# Patient Record
Sex: Male | Born: 1960 | Race: White | Hispanic: No | Marital: Single | Smoking: Current every day smoker
Health system: Southern US, Community
[De-identification: ages and names within clinical notes are randomized; demographics above are authoritative.]

## PROBLEM LIST (undated history)

## (undated) DIAGNOSIS — F329 Major depressive disorder, single episode, unspecified: Secondary | ICD-10-CM

## (undated) DIAGNOSIS — F32A Depression, unspecified: Secondary | ICD-10-CM

## (undated) DIAGNOSIS — I10 Essential (primary) hypertension: Secondary | ICD-10-CM

## (undated) HISTORY — PX: CARDIAC CATHETERIZATION: SHX172

---
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
## 2003-05-01 ENCOUNTER — Emergency Department (HOSPITAL_COMMUNITY): Admission: EM | Admit: 2003-05-01 | Discharge: 2003-05-01 | Payer: Self-pay | Admitting: Emergency Medicine

## 2014-04-02 ENCOUNTER — Emergency Department (HOSPITAL_COMMUNITY)
Admission: EM | Admit: 2014-04-02 | Discharge: 2014-04-02 | Disposition: A | Discharge status: Home / Self Care | Payer: Worker's Compensation | Attending: Emergency Medicine | Admitting: Emergency Medicine

## 2014-04-02 ENCOUNTER — Encounter (HOSPITAL_COMMUNITY): Payer: Self-pay | Admitting: Emergency Medicine

## 2014-04-02 DIAGNOSIS — Z720 Tobacco use: Secondary | ICD-10-CM | POA: Insufficient documentation

## 2014-04-02 DIAGNOSIS — Z7982 Long term (current) use of aspirin: Secondary | ICD-10-CM | POA: Diagnosis not present

## 2014-04-02 DIAGNOSIS — M545 Low back pain: Secondary | ICD-10-CM | POA: Diagnosis present

## 2014-04-02 DIAGNOSIS — Z79899 Other long term (current) drug therapy: Secondary | ICD-10-CM | POA: Insufficient documentation

## 2014-04-02 DIAGNOSIS — M5431 Sciatica, right side: Secondary | ICD-10-CM | POA: Diagnosis not present

## 2014-04-02 MED ORDER — PREDNISONE 50 MG PO TABS
60.0000 mg | ORAL_TABLET | Freq: Once | ORAL | Status: AC
  Administered 2014-04-02: 60 mg via ORAL
  Filled 2014-04-02 (×2): qty 1

## 2014-04-02 MED ORDER — PREDNISONE 10 MG PO TABS: ORAL_TABLET | ORAL | Status: DC

## 2014-04-02 MED ORDER — HYDROCODONE-ACETAMINOPHEN 5-325 MG PO TABS: 1.0000 | ORAL_TABLET | ORAL | Status: DC | PRN

## 2014-04-02 MED ORDER — HYDROCODONE-ACETAMINOPHEN 5-325 MG PO TABS
1.0000 | ORAL_TABLET | Freq: Once | ORAL | Status: AC
  Administered 2014-04-02: 1 via ORAL
  Filled 2014-04-02: qty 1

## 2014-04-02 NOTE — ED Notes (Signed)
Patient with no complaints at this time. Respirations even and unlabored. Skin warm/dry. Discharge instructions reviewed with patient at this time. Patient given opportunity to voice concerns/ask questions. Patient discharged at this time and left Emergency Department with steady gait.   
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
## 2014-04-02 NOTE — Discharge Instructions (Signed)
Sciatica °Sciatica is pain, weakness, numbness, or tingling along the path of the sciatic nerve. The nerve starts in the lower back and runs down the back of each leg. The nerve controls the muscles in the lower leg and in the back of the knee, while also providing sensation to the back of the thigh, lower leg, and the sole of your foot. Sciatica is a symptom of another medical condition. For instance, nerve damage or certain conditions, such as a herniated disk or bone spur on the spine, pinch or put pressure on the sciatic nerve. This causes the pain, weakness, or other sensations normally associated with sciatica. Generally, sciatica only affects one side of the body. °CAUSES  °· Herniated or slipped disc. °· Degenerative disk disease. °· A pain disorder involving the narrow muscle in the buttocks (piriformis syndrome). °· Pelvic injury or fracture. °· Pregnancy. °· Tumor (rare). °SYMPTOMS  °Symptoms can vary from mild to very severe. The symptoms usually travel from the low back to the buttocks and down the back of the leg. Symptoms can include: °· Mild tingling or dull aches in the lower back, leg, or hip. °· Numbness in the back of the calf or sole of the foot. °· Burning sensations in the lower back, leg, or hip. °· Sharp pains in the lower back, leg, or hip. °· Leg weakness. °· Severe back pain inhibiting movement. °These symptoms may get worse with coughing, sneezing, laughing, or prolonged sitting or standing. Also, being overweight may worsen symptoms. °DIAGNOSIS  °Your caregiver will perform a physical exam to look for common symptoms of sciatica. He or she may ask you to do certain movements or activities that would trigger sciatic nerve pain. Other tests may be performed to find the cause of the sciatica. These may include: °· Blood tests. °· X-rays. °· Imaging tests, such as an MRI or CT scan. °TREATMENT  °Treatment is directed at the cause of the sciatic pain. Sometimes, treatment is not necessary  and the pain and discomfort goes away on its own. If treatment is needed, your caregiver may suggest: °· Over-the-counter medicines to relieve pain. °· Prescription medicines, such as anti-inflammatory medicine, muscle relaxants, or narcotics. °· Applying heat or ice to the painful area. °· Steroid injections to lessen pain, irritation, and inflammation around the nerve. °· Reducing activity during periods of pain. °· Exercising and stretching to strengthen your abdomen and improve flexibility of your spine. Your caregiver may suggest losing weight if the extra weight makes the back pain worse. °· Physical therapy. °· Surgery to eliminate what is pressing or pinching the nerve, such as a bone spur or part of a herniated disk. °HOME CARE INSTRUCTIONS  °· Only take over-the-counter or prescription medicines for pain or discomfort as directed by your caregiver. °· Apply ice to the affected area for 20 minutes, 3-4 times a day for the first 48-72 hours. Then try heat in the same way. °· Exercise, stretch, or perform your usual activities if these do not aggravate your pain. °· Attend physical therapy sessions as directed by your caregiver. °· Keep all follow-up appointments as directed by your caregiver. °· Do not wear high heels or shoes that do not provide proper support. °· Check your mattress to see if it is too soft. A firm mattress may lessen your pain and discomfort. °SEEK IMMEDIATE MEDICAL CARE IF:  °· You lose control of your bowel or bladder (incontinence). °· You have increasing weakness in the lower back, pelvis, buttocks,   or legs.  You have redness or swelling of your back.  You have a burning sensation when you urinate.  You have pain that gets worse when you lie down or awakens you at night.  Your pain is worse than you have experienced in the past.  Your pain is lasting longer than 4 weeks.  You are suddenly losing weight without reason. MAKE SURE YOU:  Understand these  instructions.  Will watch your condition.  Will get help right away if you are not doing well or get worse. Document Released: 03/19/2001 Document Revised: 09/24/2011 Document Reviewed: 08/04/2011 [facility] Patient Information 2015 [place]. This information is not intended to replace advice given to you by your health care provider. Make sure you discuss any questions you have with your health care provider.    Take your next dose of prednisone tomorrow evening.    Do not drive within 4 hours of taking hydrocodone as this will make you drowsy.  Avoid lifting,  Bending,  Twisting or any other activity that worsens your pain over the next week.  Apply an  icepack  to your lower back for 10-15 minutes every 2 hours for the next 2 days.  You should get rechecked if your symptoms are not better over the next 5 days,  Or you develop increased pain,  Weakness in your leg(s) or loss of bladder or bowel function - these are symptoms of a worse injury.
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
## 2016-09-10 ENCOUNTER — Encounter (INDEPENDENT_AMBULATORY_CARE_PROVIDER_SITE_OTHER): Payer: Self-pay | Admitting: *Deleted

## 2018-02-24 ENCOUNTER — Encounter (INDEPENDENT_AMBULATORY_CARE_PROVIDER_SITE_OTHER): Payer: Self-pay | Admitting: *Deleted

## 2018-03-25 ENCOUNTER — Other Ambulatory Visit (INDEPENDENT_AMBULATORY_CARE_PROVIDER_SITE_OTHER): Payer: Self-pay | Admitting: *Deleted

## 2018-03-25 ENCOUNTER — Encounter (INDEPENDENT_AMBULATORY_CARE_PROVIDER_SITE_OTHER): Payer: Self-pay | Admitting: *Deleted

## 2018-03-25 DIAGNOSIS — Z1211 Encounter for screening for malignant neoplasm of colon: Secondary | ICD-10-CM | POA: Insufficient documentation

## 2018-06-09 ENCOUNTER — Encounter (INDEPENDENT_AMBULATORY_CARE_PROVIDER_SITE_OTHER): Payer: Self-pay | Admitting: *Deleted

## 2018-06-09 ENCOUNTER — Telehealth (INDEPENDENT_AMBULATORY_CARE_PROVIDER_SITE_OTHER): Payer: Self-pay | Admitting: *Deleted

## 2018-06-09 NOTE — Telephone Encounter (Signed)
Patient needs suprep 

## 2018-06-10 MED ORDER — SUPREP BOWEL PREP KIT 17.5-3.13-1.6 GM/177ML PO SOLN: 1.0000 | Freq: Once | ORAL | 0 refills | Status: AC

## 2018-07-02 ENCOUNTER — Telehealth (INDEPENDENT_AMBULATORY_CARE_PROVIDER_SITE_OTHER): Payer: Self-pay | Admitting: *Deleted

## 2018-07-02 NOTE — Telephone Encounter (Signed)
Referring MD/PCP: amy stanfield, np   Procedure: tcs  Reason/Indication:  screening  Has patient had this procedure before?  no  If so, when, by whom and where?    Is there a family history of colon cancer?  no  Who?  What age when diagnosed?    Is patient diabetic?   no      Does patient have prosthetic heart valve or mechanical valve?  no  Do you have a pacemaker?  no  Has patient ever had endocarditis? no  Has patient had joint replacement within last 12 months?  no  Is patient constipated or do they take laxatives? no  Does patient have a history of alcohol/drug use?  no  Is patient on blood thinner such as Coumadin, Plavix and/or Aspirin? no  Medications: hctz 25 mg daily  Allergies: nkda  Medication Adjustment per Dr Keane Police, NP:   Procedure date & time: 07/29/18 at 1030

## 2018-07-02 NOTE — Telephone Encounter (Signed)
agree

## 2018-10-12 ENCOUNTER — Encounter (INDEPENDENT_AMBULATORY_CARE_PROVIDER_SITE_OTHER): Payer: Self-pay | Admitting: *Deleted

## 2018-10-29 ENCOUNTER — Telehealth (INDEPENDENT_AMBULATORY_CARE_PROVIDER_SITE_OTHER): Payer: Self-pay | Admitting: *Deleted

## 2018-10-29 NOTE — Telephone Encounter (Signed)
agree

## 2018-10-29 NOTE — Telephone Encounter (Signed)
Referring MD/PCP: amy stanfield, np   Procedure: tcs  Reason/Indication:  screening  Has patient had this procedure before?  no             If so, when, by whom and where?    Is there a family history of colon cancer?  no             Who?  What age when diagnosed?    Is patient diabetic?   no                                                  Does patient have prosthetic heart valve or mechanical valve?  no  Do you have a pacemaker?  no  Has patient ever had endocarditis? no  Has patient had joint replacement within last 12 months?  no  Is patient constipated or do they take laxatives? no  Does patient have a history of alcohol/drug use?  no  Is patient on blood thinner such as Coumadin, Plavix and/or Aspirin? no  Medications: hctz 25 mg daily  Allergies: nkda  Medication Adjustment per Dr Lindi Adie, NP:   Procedure date & time: 11/25/18 at 930

## 2018-11-25 ENCOUNTER — Ambulatory Visit (HOSPITAL_COMMUNITY)
Admission: RE | Admit: 2018-11-25 | Payer: Commercial Managed Care - PPO | Source: Home / Self Care | Admitting: Internal Medicine

## 2018-11-25 ENCOUNTER — Encounter (HOSPITAL_COMMUNITY): Admission: RE | Payer: Self-pay | Source: Home / Self Care

## 2018-11-25 DIAGNOSIS — Z1211 Encounter for screening for malignant neoplasm of colon: Principal | ICD-10-CM

## 2018-11-25 SURGERY — COLONOSCOPY
Anesthesia: Moderate Sedation

## 2019-03-29 ENCOUNTER — Other Ambulatory Visit: Payer: Self-pay | Admitting: Orthopaedic Surgery

## 2019-04-22 ENCOUNTER — Other Ambulatory Visit: Payer: Self-pay | Admitting: Orthopaedic Surgery

## 2019-04-22 NOTE — Patient Instructions (Signed)
DUE TO COVID-19 ONLY ONE VISITOR IS ALLOWED TO COME WITH YOU AND STAY IN THE WAITING ROOM ONLY DURING PRE OP AND PROCEDURE DAY OF SURGERY. THE 1 VISITOR MAY VISIT WITH YOU AFTER SURGERY IN YOUR PRIVATE ROOM DURING VISITING HOURS ONLY!  YOU NEED TO HAVE A COVID 19 TEST ON: 04/23/19 @ 9:50 am , THIS TEST MUST BE DONE BEFORE SURGERY, COME  [place], [address] , [id number].  ([place]) ONCE YOUR COVID TEST IS COMPLETED, PLEASE BEGIN THE QUARANTINE INSTRUCTIONS AS OUTLINED IN YOUR HANDOUT.                Leslee Haueter     Your procedure is scheduled on: 04/27/2019    Report to [facility]  Entrance   Report to short stay at: 5:30 AM     Call this number if you have problems the morning of surgery [phone]    Remember:    [place], NO [place].     Take these medicines the morning of surgery with A SIP OF WATER: atorvastatin,citalopram.Tramadol as needed.                                You may not have any metal on your body including hair pins and              piercings  Do not wear jewelry,lotions, powders or perfumes, deodorant             Men may shave face and neck.     Do not bring valuables to the hospital. [place].  Contacts, dentures or bridgework may not be worn into surgery.  Leave suitcase in the car. After surgery it may be brought to your room.     Patients discharged the day of surgery will not be allowed to drive home. IF YOU ARE HAVING SURGERY AND GOING HOME THE SAME DAY, YOU MUST HAVE AN ADULT TO DRIVE YOU HOME AND  BE WITH YOU FOR 24 HOURS. YOU MAY GO HOME BY TAXI OR UBER OR ORTHERWISE, BUT AN ADULT MUST ACCOMPANY YOU HOME AND STAY WITH YOU FOR 24 HOURS.  Name and phone number of your driver:  Special Instructions: N/A              Please read over the following fact sheets you were  given: _____________________________________________________________________             NO SOLID FOOD AFTER MIDNIGHT THE NIGHT PRIOR TO SURGERY. NOTHING BY MOUTH EXCEPT CLEAR LIQUIDS UNTIL: 4:30 am . PLEASE FINISH ENSURE DRINK PER SURGEON ORDER  WHICH NEEDS TO BE COMPLETED AT: 4:30 am .   CLEAR LIQUID DIET   Foods Allowed                                                                     Foods Excluded  Coffee and tea, regular and decaf  liquids that you cannot  Plain Jell-O any favor except red or purple                                           see through such as: Fruit ices (not with fruit pulp)                                     milk, soups, orange juice  Iced Popsicles                                    All solid food Carbonated beverages, regular and diet                                    Cranberry, grape and apple juices Sports drinks like Gatorade Lightly seasoned clear broth or consume(fat free) Sugar, honey syrup  Sample Menu Breakfast                                Lunch                                     Supper Cranberry juice                    Beef broth                            Chicken broth Jell-O                                     Grape juice                           Apple juice Coffee or tea                        Jell-O                                      Popsicle                                                Coffee or tea                        Coffee or tea  _____________________________________________________________________   [facility] - Preparing for Surgery Before surgery, you can play an important role.  Because skin is not sterile, your skin needs to be as free of germs as possible.  You can reduce the number of germs on your skin by washing with CHG (chlorahexidine gluconate) soap before surgery.  CHG is an antiseptic cleaner which  kills germs and bonds with the skin to continue killing germs even after  washing. Please DO NOT use if you have an allergy to CHG or antibacterial soaps.  If your skin becomes reddened/irritated stop using the CHG and inform your nurse when you arrive at Short Stay. Do not shave (including legs and underarms) for at least 48 hours prior to the first CHG shower.  You may shave your face/neck. Please follow these instructions carefully:  1.  Shower with CHG Soap the night before surgery and the  morning of Surgery.  2.  If you choose to wash your hair, wash your hair first as usual with your  normal  shampoo.  3.  After you shampoo, rinse your hair and body thoroughly to remove the  shampoo.                           4.  Use CHG as you would any other liquid soap.  You can apply chg directly  to the skin and wash                       Gently with a scrungie or clean washcloth.  5.  Apply the CHG Soap to your body ONLY FROM THE NECK DOWN.   Do not use on face/ open                           Wound or open sores. Avoid contact with eyes, ears mouth and genitals (private parts).                       Wash face,  Genitals (private parts) with your normal soap.             6.  Wash thoroughly, paying special attention to the area where your surgery  will be performed.  7.  Thoroughly rinse your body with warm water from the neck down.  8.  DO NOT shower/wash with your normal soap after using and rinsing off  the CHG Soap.                9.  Pat yourself dry with a clean towel.            10.  Wear clean pajamas.            11.  Place clean sheets on your bed the night of your first shower and do not  sleep with pets. Day of Surgery : Do not apply any lotions/deodorants the morning of surgery.  Please wear clean clothes to the hospital/surgery center.  FAILURE TO FOLLOW THESE INSTRUCTIONS MAY RESULT IN THE CANCELLATION OF YOUR SURGERY PATIENT SIGNATURE_________________________________  NURSE  SIGNATURE__________________________________  ________________________________________________________________________     Rogelia Mire  An incentive spirometer is a tool that can help keep your lungs clear and active. This tool measures how well you are filling your lungs with each breath. Taking long deep breaths may help reverse or decrease the chance of developing breathing (pulmonary) problems (especially infection) following:  A long period of time when you are unable to move or be active. BEFORE THE PROCEDURE   If the spirometer includes an indicator to show your best effort, your nurse or respiratory therapist will set it to a desired goal.  If possible, sit up straight or lean slightly forward. Try not to slouch.  Hold the incentive  spirometer in an upright position. INSTRUCTIONS FOR USE  1. Sit on the edge of your bed if possible, or sit up as far as you can in bed or on a chair. 2. Hold the incentive spirometer in an upright position. 3. Breathe out normally. 4. Place the mouthpiece in your mouth and seal your lips tightly around it. 5. Breathe in slowly and as deeply as possible, raising the piston or the ball toward the top of the column. 6. Hold your breath for 3-5 seconds or for as long as possible. Allow the piston or ball to fall to the bottom of the column. 7. Remove the mouthpiece from your mouth and breathe out normally. 8. Rest for a few seconds and repeat Steps 1 through 7 at least 10 times every 1-2 hours when you are awake. Take your time and take a few normal breaths between deep breaths. 9. The spirometer may include an indicator to show your best effort. Use the indicator as a goal to work toward during each repetition. 10. After each set of 10 deep breaths, practice coughing to be sure your lungs are clear. If you have an incision (the cut made at the time of surgery), support your incision when coughing by placing a pillow or rolled up towels firmly  against it. Once you are able to get out of bed, walk around indoors and cough well. You may stop using the incentive spirometer when instructed by your caregiver.  RISKS AND COMPLICATIONS  Take your time so you do not get dizzy or light-headed.  If you are in pain, you may need to take or ask for pain medication before doing incentive spirometry. It is harder to take a deep breath if you are having pain. AFTER USE  Rest and breathe slowly and easily.  It can be helpful to keep track of a log of your progress. Your caregiver can provide you with a simple table to help with this. If you are using the spirometer at home, follow these instructions: [place] IF:   You are having difficultly using the spirometer.  You have trouble using the spirometer as often as instructed.  Your pain medication is not giving enough relief while using the spirometer.  You develop fever of 100.5 F (38.1 C) or higher. SEEK IMMEDIATE MEDICAL CARE IF:   You cough up bloody sputum that had not been present before.  You develop fever of 102 F (38.9 C) or greater.  You develop worsening pain at or near the incision site. MAKE SURE YOU:   Understand these instructions.  Will watch your condition.  Will get help right away if you are not doing well or get worse. Document Released: 08/05/2006 Document Revised: 06/17/2011 Document Reviewed: 10/06/2006 [facility] Patient Information 2014 [place], [place].   ________________________________________________________________________

## 2019-04-23 ENCOUNTER — Ambulatory Visit (HOSPITAL_COMMUNITY)
Admission: RE | Admit: 2019-04-23 | Discharge: 2019-04-23 | Disposition: A | Discharge status: Home / Self Care | Payer: Commercial Managed Care - PPO | Source: Ambulatory Visit | Attending: Orthopaedic Surgery | Admitting: Orthopaedic Surgery

## 2019-04-23 ENCOUNTER — Encounter (HOSPITAL_COMMUNITY)
Admission: RE | Admit: 2019-04-23 | Discharge: 2019-04-23 | Disposition: A | Discharge status: Home / Self Care | Payer: Commercial Managed Care - PPO | Source: Ambulatory Visit | Attending: Orthopaedic Surgery | Admitting: Orthopaedic Surgery

## 2019-04-23 ENCOUNTER — Encounter (HOSPITAL_COMMUNITY): Payer: Self-pay

## 2019-04-23 ENCOUNTER — Other Ambulatory Visit (HOSPITAL_COMMUNITY): Payer: Commercial Managed Care - PPO

## 2019-04-23 ENCOUNTER — Other Ambulatory Visit (HOSPITAL_COMMUNITY)
Admission: RE | Admit: 2019-04-23 | Discharge: 2019-04-23 | Disposition: A | Discharge status: Home / Self Care | Payer: Commercial Managed Care - PPO | Source: Ambulatory Visit | Attending: Orthopaedic Surgery | Admitting: Orthopaedic Surgery

## 2019-04-23 ENCOUNTER — Other Ambulatory Visit: Payer: Self-pay

## 2019-04-23 DIAGNOSIS — Z01818 Encounter for other preprocedural examination: Secondary | ICD-10-CM | POA: Insufficient documentation

## 2019-04-23 DIAGNOSIS — Z20822 Contact with and (suspected) exposure to covid-19: Secondary | ICD-10-CM | POA: Diagnosis not present

## 2019-04-23 DIAGNOSIS — I4510 Unspecified right bundle-branch block: Secondary | ICD-10-CM | POA: Insufficient documentation

## 2019-04-23 HISTORY — DX: Depression, unspecified: F32.A

## 2019-04-23 HISTORY — DX: Essential (primary) hypertension: I10

## 2019-04-23 LAB — BASIC METABOLIC PANEL
Anion gap: 9 (ref 5–15)
BUN: 17 mg/dL (ref 6–20)
CO2: 27 mmol/L (ref 22–32)
Calcium: 9.2 mg/dL (ref 8.9–10.3)
Chloride: 102 mmol/L (ref 98–111)
Creatinine, Ser: 0.84 mg/dL (ref 0.61–1.24)
GFR calc Af Amer: >60 mL/min (ref >60)
GFR calc non Af Amer: >60 mL/min (ref >60)
Glucose, Bld: 103 mg/dL — ABNORMAL HIGH (ref 70–99)
Potassium: 3.8 mmol/L (ref 3.5–5.1)
Sodium: 138 mmol/L (ref 135–145)

## 2019-04-23 LAB — CBC WITH DIFFERENTIAL/PLATELET
Abs Immature Granulocytes: 0.05 10*3/uL (ref 0.00–0.07)
Basophils Absolute: 0.1 10*3/uL (ref 0.0–0.1)
Basophils Relative: 1 %
Eosinophils Absolute: 0.2 10*3/uL (ref 0.0–0.5)
Eosinophils Relative: 3 %
HCT: 43.1 % (ref 39.0–52.0)
Hemoglobin: 14.1 g/dL (ref 13.0–17.0)
Immature Granulocytes: 1 %
Lymphocytes Relative: 17 %
Lymphs Abs: 1.5 10*3/uL (ref 0.7–4.0)
MCH: 28.3 pg (ref 26.0–34.0)
MCHC: 32.7 g/dL (ref 30.0–36.0)
MCV: 86.5 fL (ref 80.0–100.0)
Monocytes Absolute: 0.8 10*3/uL (ref 0.1–1.0)
Monocytes Relative: 9 %
Neutro Abs: 6.2 10*3/uL (ref 1.7–7.7)
Neutrophils Relative %: 69 %
Platelets: 314 10*3/uL (ref 150–400)
RBC: 4.98 MIL/uL (ref 4.22–5.81)
RDW: 13.8 % (ref 11.5–15.5)
WBC: 8.9 10*3/uL (ref 4.0–10.5)
nRBC: 0 % (ref 0.0–0.2)

## 2019-04-23 LAB — URINALYSIS, ROUTINE W REFLEX MICROSCOPIC
Bilirubin Urine: NEGATIVE
Glucose, UA: NEGATIVE mg/dL
Hgb urine dipstick: NEGATIVE
Ketones, ur: NEGATIVE mg/dL
Leukocytes,Ua: NEGATIVE
Nitrite: NEGATIVE
Protein, ur: NEGATIVE mg/dL
Specific Gravity, Urine: 1.02 (ref 1.005–1.030)
pH: 5 (ref 5.0–8.0)

## 2019-04-23 LAB — SURGICAL PCR SCREEN
MRSA, PCR: NEGATIVE
Staphylococcus aureus: NEGATIVE

## 2019-04-23 LAB — ABO/RH: ABO/RH(D): O POS

## 2019-04-23 LAB — PROTIME-INR
INR: 0.9 (ref 0.8–1.2)
Prothrombin Time: 11.7 seconds (ref 11.4–15.2)

## 2019-04-23 LAB — APTT: aPTT: 26 seconds (ref 24–36)

## 2019-04-23 NOTE — H&P (Signed)
TOTAL HIP ADMISSION H&P  Patient is admitted for right total hip arthroplasty.  Subjective:  Chief Complaint: right hip pain  HPI: Albert Salinas, 59 y.o. male, has a history of pain and functional disability in the right hip(s) due to arthritis and patient has failed non-surgical conservative treatments for greater than 12 weeks to include NSAID's and/or analgesics, flexibility and strengthening excercises, use of assistive devices, weight reduction as appropriate and activity modification.  Onset of symptoms was gradual starting 5 years ago with gradually worsening course since that time.The patient noted no past surgery on the right hip(s).  Patient currently rates pain in the right hip at 10 out of 10 with activity. Patient has night pain, worsening of pain with activity and weight bearing, trendelenberg gait, pain that interfers with activities of daily living and crepitus. Patient has evidence of subchondral cysts, subchondral sclerosis, periarticular osteophytes and joint space narrowing by imaging studies. This condition presents safety issues increasing the risk of falls. There is no current active infection.  Patient Active Problem List   Diagnosis Date Noted  . Special screening for malignant neoplasms, colon 03/25/2018   Past Medical History:  Diagnosis Date  . Depression   . Hypertension     Past Surgical History:  Procedure Laterality Date  . CARDIAC CATHETERIZATION      No current facility-administered medications for this encounter.   Current Outpatient Medications  Medication Sig Dispense Refill Last Dose  . atorvastatin (LIPITOR) 40 MG tablet Take 40 mg by mouth daily.     . citalopram (CELEXA) 10 MG tablet Take 10 mg by mouth daily.     . diclofenac (VOLTAREN) 50 MG EC tablet Take 50 mg by mouth 2 (two) times daily.     . hydrochlorothiazide (HYDRODIURIL) 25 MG tablet Take 25 mg by mouth daily.     . traMADol (ULTRAM) 50 MG tablet Take 50 mg by mouth 3 (three) times  daily as needed for moderate pain.      Marland Kitchen HYDROcodone-acetaminophen (NORCO/VICODIN) 5-325 MG per tablet Take 1 tablet by mouth every 4 (four) hours as needed. (Patient not taking: Reported on 04/22/2019) 20 tablet 0 Not Taking at Unknown time  . predniSONE (DELTASONE) 10 MG tablet 6, 5, 4, 3, 2 then 1 tablet by mouth daily for 6 days total. (Patient not taking: Reported on 04/22/2019) 21 tablet 0 Not Taking at Unknown time   No Known Allergies  Social History   Tobacco Use  . Smoking status: Current Every Day Smoker    Packs/day: 0.50    Types: Cigarettes  . Smokeless tobacco: Never Used  Substance Use Topics  . Alcohol use: No    No family history on file.   Review of Systems  Musculoskeletal: Positive for arthralgias.       Right hip  All other systems reviewed and are negative.   Objective:  Physical Exam  Constitutional: He is oriented to person, place, and time. He appears well-developed and well-nourished.  HENT:  Head: Normocephalic and atraumatic.  Eyes: Pupils are equal, round, and reactive to light.  Cardiovascular: Normal rate and regular rhythm.  Respiratory: Effort normal.  GI: Soft.  Musculoskeletal:     Cervical back: Normal range of motion.     Comments: Examination right hip shows significantly limited range of motion with a terrible pain with internal rotation.  No real tenderness palpation throughout.  Normal sensation motor function throughout both lower extremities.  Left hip range of motion is full and without pain.  He is neurovascularly intact distally.    Neurological: He is alert and oriented to person, place, and time.  Skin: Skin is warm and dry.  Psychiatric: He has a normal mood and affect. His behavior is normal. Judgment and thought content normal.    Vital signs in last 24 hours: Weight:  [79.4 kg] 79.4 kg (01/15 0807)  Labs:   Estimated body mass index is 26.61 kg/m as calculated from the following:   Height as of 04/23/19: 5\' 8"  (1.727  m).   Weight as of 04/23/19: 79.4 kg.   Imaging Review Plain radiographs demonstrate severe degenerative joint disease of the right hip(s). The bone quality appears to be good for age and reported activity level.      Assessment/Plan:  End stage primary arthritis, right hip(s)  The patient history, physical examination, clinical judgement of the provider and imaging studies are consistent with end stage degenerative joint disease of the right hip(s) and total hip arthroplasty is deemed medically necessary. The treatment options including medical management, injection therapy, arthroscopy and arthroplasty were discussed at length. The risks and benefits of total hip arthroplasty were presented and reviewed. The risks due to aseptic loosening, infection, stiffness, dislocation/subluxation,  thromboembolic complications and other imponderables were discussed.  The patient acknowledged the explanation, agreed to proceed with the plan and consent was signed. Patient is being admitted for inpatient treatment for surgery, pain control, PT, OT, prophylactic antibiotics, VTE prophylaxis, progressive ambulation and ADL's and discharge planning.The patient is planning to be discharged home with home health services

## 2019-04-23 NOTE — Progress Notes (Addendum)
PCP - Argentina Ponder FNP Cardiologist -   Chest x-ray - 04/23/19 EKG - 04/23/19 Stress Test -  ECHO -  Cardiac Cath -   Sleep Study -  CPAP -   Fasting Blood Sugar -  Checks Blood Sugar _____ times a day  Blood Thinner Instructions: Aspirin Instructions: Last Dose:  Anesthesia review: Pt. Was advised by RN to stop smoking at least 24 hours before surgery.He verbalized he will follow the recommendations.  Patient denies shortness of breath, fever, cough and chest pain at PAT appointment   Patient verbalized understanding of instructions that were given to them at the PAT appointment. Patient was also instructed that they will need to review over the PAT instructions again at home before surgery.

## 2019-04-24 LAB — NOVEL CORONAVIRUS, NAA (HOSP ORDER, SEND-OUT TO REF LAB; TAT 18-24 HRS): SARS-CoV-2, NAA: NOT DETECTED

## 2019-04-25 NOTE — Care Plan (Signed)
Ortho Bundle Case Management Note  Patient Details  Name: Albert Salinas MRN: [id number] Date of Birth: 1960/05/20    Patient plans to discharge to home with family. HHPT referral to [facility]. OPPT will be arranged at MD follow up visit if needed. Rolling walker ordered from [employer].  Patient and MD in agreement with plan. Choice offered.                 DME Arranged:  Dan Humphreys rolling DME Agency:  [employer]  HH Arranged:  PT HH Agency:  [facility]  Additional Comments: Please contact me with any questions of if this plan should need to change.  Shauna Hugh,  RN,BSN,MHA,CCM  [facility]opa[facility]ist  [phone] 04/25/2019, 9:40 PM

## 2019-04-26 ENCOUNTER — Encounter (HOSPITAL_COMMUNITY): Payer: Self-pay | Admitting: Orthopaedic Surgery

## 2019-04-26 MED ORDER — BUPIVACAINE LIPOSOME 1.3 % IJ SUSP
10.0000 mL | Freq: Once | INTRAMUSCULAR | Status: DC
  Filled 2019-04-26: qty 10

## 2019-04-26 MED ORDER — TRANEXAMIC ACID 1000 MG/10ML IV SOLN
2000.0000 mg | INTRAVENOUS | Status: DC
  Filled 2019-04-26: qty 20

## 2019-04-26 NOTE — Anesthesia Preprocedure Evaluation (Addendum)
Anesthesia Evaluation  Patient identified by MRN, date of birth, ID band  Reviewed: Allergy & Precautions, NPO status , Patient's Chart, lab work & pertinent test results  Airway Mallampati: II  TM Distance: >3 FB Neck ROM: Full    Dental  (+) Edentulous Upper, Edentulous Lower, Dental Advisory Given   Pulmonary Current SmokerPatient did not abstain from smoking.,    Pulmonary exam normal breath sounds clear to auscultation       Cardiovascular hypertension, Pt. on medications Normal cardiovascular exam Rhythm:Regular Rate:Normal     Neuro/Psych PSYCHIATRIC DISORDERS Depression negative neurological ROS     GI/Hepatic negative GI ROS, Neg liver ROS,   Endo/Other  negative endocrine ROS  Renal/GU negative Renal ROS     Musculoskeletal negative musculoskeletal ROS (+)   Abdominal   Peds  Hematology negative hematology ROS (+)   Anesthesia Other Findings   Reproductive/Obstetrics                            Anesthesia Physical Anesthesia Plan  ASA: II  Anesthesia Plan: Spinal   Post-op Pain Management:    Induction: Intravenous  PONV Risk Score and Plan: 1 and Ondansetron, Propofol infusion and Treatment may vary due to age or medical condition  Airway Management Planned: Natural Airway  Additional Equipment:   Intra-op Plan:   Post-operative Plan:   Informed Consent: I have reviewed the patients History and Physical, chart, labs and discussed the procedure including the risks, benefits and alternatives for the proposed anesthesia with the patient or authorized representative who has indicated his/her understanding and acceptance.     Dental advisory given  Plan Discussed with: CRNA  Anesthesia Plan Comments:        Anesthesia Quick Evaluation

## 2019-04-27 ENCOUNTER — Encounter (HOSPITAL_COMMUNITY)
Admission: RE | Disposition: A | Discharge status: Home / Self Care | Payer: Self-pay | Source: Home / Self Care | Attending: Orthopaedic Surgery

## 2019-04-27 ENCOUNTER — Ambulatory Visit (HOSPITAL_COMMUNITY): Payer: Commercial Managed Care - PPO

## 2019-04-27 ENCOUNTER — Ambulatory Visit (HOSPITAL_COMMUNITY): Payer: Commercial Managed Care - PPO | Admitting: Physician Assistant

## 2019-04-27 ENCOUNTER — Ambulatory Visit (HOSPITAL_COMMUNITY)
Admission: RE | Admit: 2019-04-27 | Discharge: 2019-04-27 | Disposition: A | Discharge status: Home / Self Care | Payer: Commercial Managed Care - PPO | Attending: Orthopaedic Surgery | Admitting: Orthopaedic Surgery

## 2019-04-27 ENCOUNTER — Encounter (HOSPITAL_COMMUNITY): Payer: Self-pay | Admitting: Orthopaedic Surgery

## 2019-04-27 DIAGNOSIS — Z791 Long term (current) use of non-steroidal anti-inflammatories (NSAID): Secondary | ICD-10-CM | POA: Insufficient documentation

## 2019-04-27 DIAGNOSIS — M1611 Unilateral primary osteoarthritis, right hip: Secondary | ICD-10-CM | POA: Diagnosis present

## 2019-04-27 DIAGNOSIS — F17210 Nicotine dependence, cigarettes, uncomplicated: Secondary | ICD-10-CM | POA: Insufficient documentation

## 2019-04-27 DIAGNOSIS — Z419 Encounter for procedure for purposes other than remedying health state, unspecified: Secondary | ICD-10-CM

## 2019-04-27 DIAGNOSIS — Z79899 Other long term (current) drug therapy: Secondary | ICD-10-CM | POA: Diagnosis not present

## 2019-04-27 DIAGNOSIS — F329 Major depressive disorder, single episode, unspecified: Secondary | ICD-10-CM | POA: Insufficient documentation

## 2019-04-27 DIAGNOSIS — I10 Essential (primary) hypertension: Secondary | ICD-10-CM | POA: Diagnosis not present

## 2019-04-27 HISTORY — PX: TOTAL HIP ARTHROPLASTY: SHX124

## 2019-04-27 LAB — TYPE AND SCREEN
ABO/RH(D): O POS
Antibody Screen: NEGATIVE

## 2019-04-27 SURGERY — ARTHROPLASTY, HIP, TOTAL, ANTERIOR APPROACH
Anesthesia: Spinal | Site: Hip | Laterality: Right

## 2019-04-27 MED ORDER — FENTANYL CITRATE (PF) 100 MCG/2ML IJ SOLN
INTRAMUSCULAR | Status: AC
  Filled 2019-04-27: qty 2

## 2019-04-27 MED ORDER — STERILE WATER FOR IRRIGATION IR SOLN
Status: DC | PRN
  Administered 2019-04-27: 2000 mL

## 2019-04-27 MED ORDER — PHENYLEPHRINE HCL (PRESSORS) 10 MG/ML IV SOLN
INTRAVENOUS | Status: AC
  Filled 2019-04-27: qty 1

## 2019-04-27 MED ORDER — ACETAMINOPHEN 500 MG PO TABS: 500.0000 mg | ORAL_TABLET | Freq: Four times a day (QID) | ORAL | Status: DC

## 2019-04-27 MED ORDER — ONDANSETRON HCL 4 MG PO TABS
4.0000 mg | ORAL_TABLET | Freq: Four times a day (QID) | ORAL | Status: DC | PRN
  Filled 2019-04-27: qty 1

## 2019-04-27 MED ORDER — PROPOFOL 500 MG/50ML IV EMUL
INTRAVENOUS | Status: AC
  Filled 2019-04-27: qty 150

## 2019-04-27 MED ORDER — KETOROLAC TROMETHAMINE 15 MG/ML IJ SOLN: 15.0000 mg | Freq: Four times a day (QID) | INTRAMUSCULAR | Status: DC

## 2019-04-27 MED ORDER — DEXAMETHASONE SODIUM PHOSPHATE 10 MG/ML IJ SOLN
INTRAMUSCULAR | Status: AC
  Filled 2019-04-27: qty 3

## 2019-04-27 MED ORDER — METOCLOPRAMIDE HCL 5 MG PO TABS
5.0000 mg | ORAL_TABLET | Freq: Three times a day (TID) | ORAL | Status: DC | PRN
  Filled 2019-04-27: qty 2

## 2019-04-27 MED ORDER — BUPIVACAINE LIPOSOME 1.3 % IJ SUSP
INTRAMUSCULAR | Status: DC | PRN
  Administered 2019-04-27: 10 mL

## 2019-04-27 MED ORDER — POVIDONE-IODINE 10 % EX SWAB
2.0000 "application " | Freq: Once | CUTANEOUS | Status: AC
  Administered 2019-04-27: 2 via TOPICAL

## 2019-04-27 MED ORDER — PHENYLEPHRINE HCL-NACL 10-0.9 MG/250ML-% IV SOLN
INTRAVENOUS | Status: DC | PRN
  Administered 2019-04-27: 15 ug/min via INTRAVENOUS

## 2019-04-27 MED ORDER — HYDROMORPHONE HCL 1 MG/ML IJ SOLN
INTRAMUSCULAR | Status: AC
  Administered 2019-04-27: 0.5 mg via INTRAVENOUS
  Filled 2019-04-27: qty 1

## 2019-04-27 MED ORDER — LACTATED RINGERS IV BOLUS
500.0000 mL | Freq: Once | INTRAVENOUS | Status: AC
  Administered 2019-04-27: 500 mL via INTRAVENOUS

## 2019-04-27 MED ORDER — ONDANSETRON HCL 4 MG/2ML IJ SOLN
INTRAMUSCULAR | Status: AC
  Filled 2019-04-27: qty 6

## 2019-04-27 MED ORDER — LACTATED RINGERS IV BOLUS: 250.0000 mL | Freq: Once | INTRAVENOUS | Status: DC

## 2019-04-27 MED ORDER — GLYCOPYRROLATE PF 0.2 MG/ML IJ SOSY
PREFILLED_SYRINGE | INTRAMUSCULAR | Status: DC | PRN
  Administered 2019-04-27 (×2): .1 mg via INTRAVENOUS

## 2019-04-27 MED ORDER — ASPIRIN EC 81 MG PO TBEC: 81.0000 mg | DELAYED_RELEASE_TABLET | Freq: Every day | ORAL | 0 refills | Status: AC

## 2019-04-27 MED ORDER — TIZANIDINE HCL 4 MG PO TABS: 4.0000 mg | ORAL_TABLET | Freq: Four times a day (QID) | ORAL | 1 refills | Status: AC | PRN

## 2019-04-27 MED ORDER — BUPIVACAINE HCL (PF) 0.25 % IJ SOLN
INTRAMUSCULAR | Status: AC
  Filled 2019-04-27: qty 30

## 2019-04-27 MED ORDER — ALBUTEROL SULFATE HFA 108 (90 BASE) MCG/ACT IN AERS
INHALATION_SPRAY | RESPIRATORY_TRACT | Status: DC | PRN
  Administered 2019-04-27: 6 via RESPIRATORY_TRACT

## 2019-04-27 MED ORDER — FENTANYL CITRATE (PF) 100 MCG/2ML IJ SOLN
INTRAMUSCULAR | Status: DC | PRN
  Administered 2019-04-27: 50 ug via INTRAVENOUS
  Administered 2019-04-27 (×2): 25 ug via INTRAVENOUS

## 2019-04-27 MED ORDER — PROPOFOL 10 MG/ML IV BOLUS
INTRAVENOUS | Status: DC | PRN
  Administered 2019-04-27: 30 mg via INTRAVENOUS
  Administered 2019-04-27: 120 ug/kg/min via INTRAVENOUS
  Administered 2019-04-27: 20 mg via INTRAVENOUS

## 2019-04-27 MED ORDER — LACTATED RINGERS IV SOLN: INTRAVENOUS | Status: DC

## 2019-04-27 MED ORDER — PROPOFOL 10 MG/ML IV BOLUS
INTRAVENOUS | Status: AC
  Filled 2019-04-27: qty 20

## 2019-04-27 MED ORDER — CHLORHEXIDINE GLUCONATE 4 % EX LIQD: 60.0000 mL | Freq: Once | CUTANEOUS | Status: DC

## 2019-04-27 MED ORDER — TRANEXAMIC ACID-NACL 1000-0.7 MG/100ML-% IV SOLN
1000.0000 mg | Freq: Once | INTRAVENOUS | Status: AC
  Administered 2019-04-27: 1000 mg via INTRAVENOUS

## 2019-04-27 MED ORDER — ONDANSETRON HCL 4 MG/2ML IJ SOLN
INTRAMUSCULAR | Status: DC | PRN
  Administered 2019-04-27: 4 mg via INTRAVENOUS

## 2019-04-27 MED ORDER — METHOCARBAMOL 500 MG IVPB - SIMPLE MED: 500.0000 mg | Freq: Four times a day (QID) | INTRAVENOUS | Status: DC | PRN

## 2019-04-27 MED ORDER — ONDANSETRON HCL 4 MG/2ML IJ SOLN: 4.0000 mg | Freq: Four times a day (QID) | INTRAMUSCULAR | Status: DC | PRN

## 2019-04-27 MED ORDER — METOCLOPRAMIDE HCL 5 MG/ML IJ SOLN: 5.0000 mg | Freq: Three times a day (TID) | INTRAMUSCULAR | Status: DC | PRN

## 2019-04-27 MED ORDER — TRANEXAMIC ACID-NACL 1000-0.7 MG/100ML-% IV SOLN
INTRAVENOUS | Status: AC
  Filled 2019-04-27: qty 100

## 2019-04-27 MED ORDER — METHOCARBAMOL 500 MG PO TABS: 500.0000 mg | ORAL_TABLET | Freq: Four times a day (QID) | ORAL | Status: DC | PRN

## 2019-04-27 MED ORDER — PROPOFOL 500 MG/50ML IV EMUL
INTRAVENOUS | Status: AC
  Filled 2019-04-27: qty 50

## 2019-04-27 MED ORDER — PROPOFOL 10 MG/ML IV BOLUS
INTRAVENOUS | Status: AC
  Filled 2019-04-27: qty 40

## 2019-04-27 MED ORDER — CEFAZOLIN SODIUM-DEXTROSE 2-4 GM/100ML-% IV SOLN
2.0000 g | INTRAVENOUS | Status: AC
  Administered 2019-04-27: 2 g via INTRAVENOUS
  Filled 2019-04-27: qty 100

## 2019-04-27 MED ORDER — OXYCODONE HCL 5 MG PO TABS: 5.0000 mg | ORAL_TABLET | Freq: Once | ORAL | Status: DC | PRN

## 2019-04-27 MED ORDER — 0.9 % SODIUM CHLORIDE (POUR BTL) OPTIME
TOPICAL | Status: DC | PRN
  Administered 2019-04-27: 08:00:00 1000 mL

## 2019-04-27 MED ORDER — MEPERIDINE HCL 50 MG/ML IJ SOLN: 6.2500 mg | INTRAMUSCULAR | Status: DC | PRN

## 2019-04-27 MED ORDER — HYDROCODONE-ACETAMINOPHEN 7.5-325 MG PO TABS
ORAL_TABLET | ORAL | Status: AC
  Filled 2019-04-27: qty 2

## 2019-04-27 MED ORDER — TRANEXAMIC ACID-NACL 1000-0.7 MG/100ML-% IV SOLN
1000.0000 mg | INTRAVENOUS | Status: AC
  Administered 2019-04-27: 1000 mg via INTRAVENOUS
  Filled 2019-04-27: qty 100

## 2019-04-27 MED ORDER — HYDROCODONE-ACETAMINOPHEN 7.5-325 MG PO TABS
1.0000 | ORAL_TABLET | ORAL | Status: DC | PRN
  Administered 2019-04-27: 2 via ORAL

## 2019-04-27 MED ORDER — PROMETHAZINE HCL 25 MG/ML IJ SOLN: 6.2500 mg | INTRAMUSCULAR | Status: DC | PRN

## 2019-04-27 MED ORDER — DEXAMETHASONE SODIUM PHOSPHATE 10 MG/ML IJ SOLN
INTRAMUSCULAR | Status: DC | PRN
  Administered 2019-04-27: 10 mg via INTRAVENOUS

## 2019-04-27 MED ORDER — MIDAZOLAM HCL 2 MG/2ML IJ SOLN
INTRAMUSCULAR | Status: AC
  Filled 2019-04-27: qty 2

## 2019-04-27 MED ORDER — MORPHINE SULFATE (PF) 4 MG/ML IV SOLN: 0.5000 mg | INTRAVENOUS | Status: DC | PRN

## 2019-04-27 MED ORDER — CEFAZOLIN SODIUM-DEXTROSE 2-4 GM/100ML-% IV SOLN: 2.0000 g | Freq: Four times a day (QID) | INTRAVENOUS | Status: DC

## 2019-04-27 MED ORDER — ACETAMINOPHEN 325 MG PO TABS: 325.0000 mg | ORAL_TABLET | Freq: Four times a day (QID) | ORAL | Status: DC | PRN

## 2019-04-27 MED ORDER — HYDROCODONE-ACETAMINOPHEN 5-325 MG PO TABS: 1.0000 | ORAL_TABLET | ORAL | Status: DC | PRN

## 2019-04-27 MED ORDER — HYDROCODONE-ACETAMINOPHEN 5-325 MG PO TABS: 1.0000 | ORAL_TABLET | Freq: Four times a day (QID) | ORAL | 0 refills | Status: AC | PRN

## 2019-04-27 MED ORDER — MIDAZOLAM HCL 5 MG/5ML IJ SOLN
INTRAMUSCULAR | Status: DC | PRN
  Administered 2019-04-27: 2 mg via INTRAVENOUS

## 2019-04-27 MED ORDER — TRANEXAMIC ACID 1000 MG/10ML IV SOLN
INTRAVENOUS | Status: DC | PRN
  Administered 2019-04-27: 09:00:00 2000 mg via TOPICAL

## 2019-04-27 MED ORDER — BUPIVACAINE IN DEXTROSE 0.75-8.25 % IT SOLN
INTRATHECAL | Status: DC | PRN
  Administered 2019-04-27: 1.8 mL via INTRATHECAL

## 2019-04-27 MED ORDER — OXYCODONE HCL 5 MG/5ML PO SOLN: 5.0000 mg | Freq: Once | ORAL | Status: DC | PRN

## 2019-04-27 MED ORDER — HYDROMORPHONE HCL 1 MG/ML IJ SOLN: 0.2500 mg | INTRAMUSCULAR | Status: DC | PRN

## 2019-04-27 MED ORDER — BUPIVACAINE HCL (PF) 0.25 % IJ SOLN
INTRAMUSCULAR | Status: DC | PRN
  Administered 2019-04-27: 30 mL

## 2019-04-27 SURGICAL SUPPLY — 45 items
BAG DECANTER FOR FLEXI CONT (MISCELLANEOUS) ×3 IMPLANT
BLADE SAW SGTL 18X1.27X75 (BLADE) ×2 IMPLANT
BLADE SAW SGTL 18X1.27X75MM (BLADE) ×1
BOOTIES KNEE HIGH SLOAN (MISCELLANEOUS) ×3 IMPLANT
CELLS DAT CNTRL 66122 CELL SVR (MISCELLANEOUS) ×1 IMPLANT
COVER PERINEAL POST (MISCELLANEOUS) ×3 IMPLANT
COVER SURGICAL LIGHT HANDLE (MISCELLANEOUS) ×3 IMPLANT
COVER WAND RF STERILE (DRAPES) ×2 IMPLANT
CUP ACETABULAR GRIPTON 100 52 (Orthopedic Implant) IMPLANT
DECANTER SPIKE VIAL GLASS SM (MISCELLANEOUS) ×3 IMPLANT
DRAPE IMP U-DRAPE 54X76 (DRAPES) ×3 IMPLANT
DRAPE STERI IOBAN 125X83 (DRAPES) ×3 IMPLANT
DRAPE U-SHAPE 47X51 STRL (DRAPES) ×6 IMPLANT
DRSG AQUACEL AG ADV 3.5X 6 (GAUZE/BANDAGES/DRESSINGS) ×3 IMPLANT
DURAPREP 26ML APPLICATOR (WOUND CARE) ×3 IMPLANT
ELECT BLADE TIP CTD 4 INCH (ELECTRODE) ×3 IMPLANT
ELECT REM PT RETURN 15FT ADLT (MISCELLANEOUS) ×3 IMPLANT
ELIMINATOR HOLE APEX DEPUY (Hips) ×2 IMPLANT
GLOVE BIO SURGEON STRL SZ8 (GLOVE) ×6 IMPLANT
GLOVE BIOGEL PI IND STRL 8 (GLOVE) ×2 IMPLANT
GLOVE BIOGEL PI INDICATOR 8 (GLOVE) ×4
GOWN STRL REUS W/TWL XL LVL3 (GOWN DISPOSABLE) ×6 IMPLANT
GRIPTON 100 52 (Orthopedic Implant) ×3 IMPLANT
HEAD CERAMIC DELTA 36 PLUS 1.5 (Hips) ×2 IMPLANT
HOLDER FOLEY CATH W/STRAP (MISCELLANEOUS) ×3 IMPLANT
KIT TURNOVER KIT A (KITS) IMPLANT
LINER NEUTRAL 52X36MM PLUS 4 (Liner) ×2 IMPLANT
MANIFOLD NEPTUNE II (INSTRUMENTS) ×3 IMPLANT
NEEDLE HYPO 22GX1.5 SAFETY (NEEDLE) ×3 IMPLANT
NS IRRIG 1000ML POUR BTL (IV SOLUTION) ×3 IMPLANT
PACK ANTERIOR HIP CUSTOM (KITS) ×3 IMPLANT
PENCIL SMOKE EVACUATOR (MISCELLANEOUS) IMPLANT
PROTECTOR NERVE ULNAR (MISCELLANEOUS) ×3 IMPLANT
RETRACTOR WND ALEXIS 18 MED (MISCELLANEOUS) ×1 IMPLANT
RTRCTR WOUND ALEXIS 18CM MED (MISCELLANEOUS) ×3
STEM FEMORAL SZ6 HIGH ACTIS (Stem) ×2 IMPLANT
SUT ETHIBOND NAB CT1 #1 30IN (SUTURE) ×6 IMPLANT
SUT VIC AB 1 CT1 36 (SUTURE) ×3 IMPLANT
SUT VIC AB 2-0 CT1 27 (SUTURE) ×2
SUT VIC AB 2-0 CT1 TAPERPNT 27 (SUTURE) ×1 IMPLANT
SUT VICRYL AB 3-0 FS1 BRD 27IN (SUTURE) ×3 IMPLANT
SUT VLOC 180 0 24IN GS25 (SUTURE) ×3 IMPLANT
SYR 50ML LL SCALE MARK (SYRINGE) ×3 IMPLANT
TRAY FOLEY MTR SLVR 16FR STAT (SET/KITS/TRAYS/PACK) ×3 IMPLANT
YANKAUER SUCT BULB TIP 10FT TU (MISCELLANEOUS) ×3 IMPLANT

## 2019-04-27 NOTE — Discharge Instructions (Signed)
General Anesthesia, Adult, Care After This sheet gives you information about how to care for yourself after your procedure. Your health care provider may also give you more specific instructions. If you have problems or questions, contact your health care provider. What can I expect after the procedure? After the procedure, the following side effects are common:  Pain or discomfort at the IV site.  Nausea.  Vomiting.  Sore throat.  Trouble concentrating.  Feeling cold or chills.  Weak or tired.  Sleepiness and fatigue.  Soreness and body aches. These side effects can affect parts of the body that were not involved in surgery. Follow these instructions at home:  For at least 24 hours after the procedure:  Have a responsible adult stay with you. It is important to have someone help care for you until you are awake and alert.  Rest as needed.  Do not: ? Participate in activities in which you could fall or become injured. ? Drive. ? Use heavy machinery. ? Drink alcohol. ? Take sleeping pills or medicines that cause drowsiness. ? Make important decisions or sign legal documents. ? Take care of children on your own. Eating and drinking  Follow any instructions from your health care provider about eating or drinking restrictions.  When you feel hungry, start by eating small amounts of foods that are soft and easy to digest (bland), such as toast. Gradually return to your regular diet.  Drink enough fluid to keep your urine pale yellow.  If you vomit, rehydrate by drinking water, juice, or clear broth. General instructions  If you have sleep apnea, surgery and certain medicines can increase your risk for breathing problems. Follow instructions from your health care provider about wearing your sleep device: ? Anytime you are sleeping, including during daytime naps. ? While taking prescription pain medicines, sleeping medicines, or medicines that make you drowsy.  Return to  your normal activities as told by your health care provider. Ask your health care provider what activities are safe for you.  Take over-the-counter and prescription medicines only as told by your health care provider.  If you smoke, do not smoke without supervision.  Keep all follow-up visits as told by your health care provider. This is important. Contact a health care provider if:  You have nausea or vomiting that does not get better with medicine.  You cannot eat or drink without vomiting.  You have pain that does not get better with medicine.  You are unable to pass urine.  You develop a skin rash.  You have a fever.  You have redness around your IV site that gets worse. Get help right away if:  You have difficulty breathing.  You have chest pain.  You have blood in your urine or stool, or you vomit blood. Summary  After the procedure, it is common to have a sore throat or nausea. It is also common to feel tired.  Have a responsible adult stay with you for the first 24 hours after general anesthesia. It is important to have someone help care for you until you are awake and alert.  When you feel hungry, start by eating small amounts of foods that are soft and easy to digest (bland), such as toast. Gradually return to your regular diet.  Drink enough fluid to keep your urine pale yellow.  Return to your normal activities as told by your health care provider. Ask your health care provider what activities are safe for you. This information is not   intended to replace advice given to you by your health care provider. Make sure you discuss any questions you have with your health care provider. Document Revised: 03/28/2017 Document Reviewed: 11/08/2016 Elsevier Patient Education  2020 Else[employer]   Total Hip Replacement, Anterior, Care After This sheet gives you information about how to care for yourself after your procedure. Your doctor may also give you more specific  instructions. If you have problems or questions, contact your doctor. What can I expect after the procedure? After the procedure, it is common to have:  Pain.  Stiffness.  Discomfort. Follow these instructions at home: Medicines  Take over-the-counter and prescription medicines only as told by your doctor.  If you were prescribed a medicine to thin your blood (anticoagulant), take it as told by your doctor. Surgery cut care   Follow instructions from your doctor about how to take care of your cut (incision) from surgery. Make sure you: ? Wash your hands with soap and water before you change your bandage (dressing). If you cannot use soap and water, use alcohol-based hand sanitizer. ? Change your bandage as told by your doctor. ? Leave stitches (sutures), skin glue, or skin tape (adhesive) strips in place. They may need to stay in place for 2 weeks or longer. If tape strips get loose and curl up, you may trim the loose edges. Do not remove tape strips completely unless your doctor tells you to do that.  Check your surgical cut every day for signs of infection. Check for: ? Redness, swelling, or pain. ? Fluid or blood. ? Warmth. ? Pus or a bad smell. Bathing  Do not take baths, swim, or use a hot tub until your doctor says it is okay.  Keep the bandage dry until your doctor says it can be removed. Managing pain, stiffness, and swelling   If directed, put ice on the hip area. ? Put ice in a plastic bag. ? Place a towel between your skin and the bag. ? Leave the ice on for 20 minutes, 2-3 times a day.  Move your toes often to avoid stiffness and to lessen swelling.  Raise (elevate) your leg above the level of your heart while you are sitting or lying down. Activity  Rest as told by your doctor.  Do not sit for a long time without moving. Get up to take short walks every 1-2 hours. This is important. Ask for help if you feel weak or unsteady.  Do exercises as told by your  doctor or physical therapist.  Use a walker, crutches, or a cane as told by your doctor. ? You may use your legs to support (bear) your body weight as told by your doctor. Follow instructions about how much weight you may safely support on your affected leg (weight-bearing restrictions). ? A physical therapist may show you how to get out of a bed and chair and how to go up and down stairs. You will first do this with a walker, crutches, or a cane. Then you will do it without any of these devices. ? Once you are able to walk without a limp, you may stop using a walker, crutches, or cane.  Return to your normal activities as told by your doctor. Ask your doctor what activities are safe for you. Safety  To help prevent falls: ? Keep floors clear of objects you may trip over. ? Place items that you may need within easy reach.  Wear an apron or tool belt with  pockets for carrying objects. This leaves your hands free to help with your balance. Driving  Do not drive or use heavy machinery while taking prescription pain medicine.  Ask your doctor when it is safe to drive. General instructions  Wear compression stockings as told by your doctor. These help to prevent blood clots and reduce swelling in your legs.  Keep doing breathing exercises as told by your doctor. This helps prevent lung infection.  If you are taking prescription pain medicine, take actions to prevent or treat constipation. Your doctor may suggest that you: ? Drink enough fluid to keep your pee (urine) pale yellow. ? Eat foods that are high in fiber. These include fresh fruits and vegetables, whole grains, and beans. ? Limit foods that are high in fat and sugar. These include fried or sweet foods. ? Take an over-the-counter or prescription medicine for constipation.  Do not use any products that have nicotine or tobacco in them, such as cigarettes and e-cigarettes. These can delay bone healing. If you need help quitting, ask  your doctor.  Tell your doctor if you plan to have dental work. Also: ? Tell your dentist about your joint replacement. ? Ask your doctor if there are instructions you need to follow before dental care and routine cleanings.  Keep all follow-up visits as told by your doctor. This is important. Contact a doctor if:  You have a fever or chills.  You have a cough.  You feel short of breath.  Your medicine is not helping your pain.  You have any of these at or near your cut from surgery: ? Redness, swelling, or pain. ? Fluid or blood. ? An area that feels warm when you touch it. ? Pus or a bad smell. Get help right away if:  You have very bad pain.  You have trouble breathing.  You have chest pain.  You have redness, swelling, pain, and warmth in your calf or leg. Summary  Follow instructions from your doctor about how to take care of your surgery cut (incision).  Do not take baths, swim, or use a hot tub until your doctor says it is okay.  Use crutches, a walker, or a cane as told by your doctor.  If you were prescribed a medicine to thin your blood (anticoagulant), take it as told by your doctor. This information is not intended to replace advice given to you by your health care provider. Make sure you discuss any questions you have with your health care provider. Document Revised: 08/03/2018 Document Reviewed: 07/09/2017 Elsevier Patient Education  [employer].

## 2019-04-27 NOTE — Evaluation (Signed)
Physical Therapy Evaluation Patient Details Name: Albert Salinas MRN: [id number] DOB: [birth year] Today's Date: 04/27/2019   History of Present Illness  R DA-THA  Clinical Impression  Pt has met PT goals and is ready to DC home from PT standpoint. He ambulated 37' with RW, no loss of balance. Stair training completed. Pt demonstrates understanding of HEP.       Follow Up Recommendations Follow surgeon's recommendation for DC plan and follow-up therapies    Equipment Recommendations  Rolling walker with 5" wheels    Recommendations for Other Services       Precautions / Restrictions Precautions Precautions: Fall Precaution Comments: recommended removal of throw rugs Restrictions Weight Bearing Restrictions: No Other Position/Activity Restrictions: WBAT      Mobility  Bed Mobility Overal bed mobility: Modified Independent             General bed mobility comments: HOB up  Transfers Overall transfer level: Needs assistance Equipment used: Rolling walker (2 wheeled) Transfers: Sit to/from Stand Sit to Stand: Supervision         General transfer comment: VCs hand placement  Ambulation/Gait Ambulation/Gait assistance: Min guard;Supervision Gait Distance (Feet): 170 Feet Assistive device: Rolling walker (2 wheeled) Gait Pattern/deviations: Step-through pattern;Decreased stride length Gait velocity: WFL   General Gait Details: VCs sequencing, no loss of balance  Stairs Stairs: Yes Stairs assistance: Min guard Stair Management: Two rails;Step to pattern;Forwards Number of Stairs: 3 General stair comments: VCs sequencing  Wheelchair Mobility    Modified Rankin (Stroke Patients Only)       Balance Overall balance assessment: Modified Independent                                           Pertinent Vitals/Pain Pain Assessment: 0-10 Pain Score: 3  Pain Location: R hip Pain Descriptors / Indicators: Sore Pain Intervention(s): Limited  activity within patient's tolerance;Monitored during session;Premedicated before session;Ice applied    Home Living Family/patient expects to be discharged to:: Private residence Living Arrangements: Spouse/significant other Available Help at Discharge: Available 24 hours/day   Home Access: Stairs to enter Entrance Stairs-Rails: Can reach both Entrance Stairs-Number of Steps: 4 Home Layout: One level Home Equipment: None      Prior Function Level of Independence: Independent         Comments: worked in Radiation protection practitioner        Extremity/Trunk Assessment   Upper Extremity Assessment Upper Extremity Assessment: Overall WFL for tasks assessed    Lower Extremity Assessment Lower Extremity Assessment: RLE deficits/detail RLE Deficits / Details: knee ext at least 3/5 RLE Sensation: WNL RLE Coordination: WNL    Cervical / Trunk Assessment Cervical / Trunk Assessment: Normal  Communication   Communication: No difficulties  Cognition Arousal/Alertness: Awake/alert Behavior During Therapy: WFL for tasks assessed/performed Overall Cognitive Status: Within Functional Limits for tasks assessed                                        General Comments      Exercises Total Joint Exercises Ankle Circles/Pumps: AROM;10 reps;Both Quad Sets: AROM;Right;5 reps;Supine Short Arc Quad: AROM;Right;10 reps;Supine Heel Slides: AAROM;Right;10 reps;Supine Hip ABduction/ADduction: AAROM;Right;10 reps;Supine Long Arc Quad: AROM;Right;5 reps;Seated   Assessment/Plan    PT Assessment Patent does not need any  further PT services  PT Problem List         PT Treatment Interventions      PT Goals (Current goals can be found in the Care Plan section)  Acute Rehab PT Goals PT Goal Formulation: All assessment and education complete, DC therapy    Frequency     Barriers to discharge        Co-evaluation               AM-PAC PT "6  Clicks" Mobility  Outcome Measure Help needed turning from your back to your side while in a flat bed without using bedrails?: None Help needed moving from lying on your back to sitting on the side of a flat bed without using bedrails?: None Help needed moving to and from a bed to a chair (including a wheelchair)?: None Help needed standing up from a chair using your arms (e.g., wheelchair or bedside chair)?: None Help needed to walk in hospital room?: None Help needed climbing 3-5 steps with a railing? : A Little 6 Click Score: 23    End of Session Equipment Utilized During Treatment: Gait belt Activity Tolerance: Patient tolerated treatment well Patient left: in bed;with call bell/phone within reach Nurse Communication: Mobility status PT Visit Diagnosis: Pain;Difficulty in walking, not elsewhere classified (R26.2) Pain - Right/Left: Right Pain - part of body: Hip    Time: 6154-8845 PT Time Calculation (min) (ACUTE ONLY): 21 min   Charges:   PT Evaluation $PT Eval Low Complexity: 1 Low         Philomena Doheny PT 04/27/2019  Acute Rehabilitation Services Pager [phone] Office [phone]

## 2019-04-27 NOTE — Transfer of Care (Signed)
Immediate Anesthesia Transfer of Care Note  Patient: Albert Salinas  Procedure(s) Performed: Procedure(s) with comments: RIGHT TOTAL HIP ARTHROPLASTY ANTERIOR APPROACH (Right) - pending PRE-OP FOR SDDC APPOROVAL  Patient Location: PACU  Anesthesia Type:Spinal  Level of Consciousness:  sedated, patient cooperative and responds to stimulation  Airway & Oxygen Therapy:Patient Spontanous Breathing and Patient connected to face mask oxgen  Post-op Assessment:  Report given to PACU RN and Post -op Vital signs reviewed and stable  Post vital signs:  Reviewed and stable  Last Vitals:  Vitals:   04/27/19 0533 04/27/19 0930  BP: 125/85 100/63  Pulse: 82 (P) 80  Resp: 18 19  Temp: 36.5 C (P) 36.4 C  SpO2: 100% (P) 100%    Complications: No apparent anesthesia complications

## 2019-04-27 NOTE — Anesthesia Postprocedure Evaluation (Signed)
Anesthesia Post Note  Patient: Albert Salinas  Procedure(s) Performed: RIGHT TOTAL HIP ARTHROPLASTY ANTERIOR APPROACH (Right Hip)     Patient location during evaluation: PACU Anesthesia Type: Spinal Level of consciousness: sedated and patient cooperative Pain management: pain level controlled Vital Signs Assessment: post-procedure vital signs reviewed and stable Respiratory status: spontaneous breathing Cardiovascular status: stable Anesthetic complications: no    Last Vitals:  Vitals:   04/27/19 1310 04/27/19 1400  BP: (!) 132/91 (!) 145/90  Pulse: 68   Resp: 18   Temp:    SpO2: 98%     Last Pain:  Vitals:   04/27/19 1310  TempSrc:   PainSc: 3                  Albert Salinas

## 2019-04-27 NOTE — Anesthesia Procedure Notes (Signed)
Spinal  Patient location during procedure: OR Staffing Anesthesiologist: Steve Youngberg, MD Preanesthetic Checklist Completed: patient identified, IV checked, site marked, risks and benefits discussed, surgical consent, monitors and equipment checked, pre-op evaluation and timeout performed Spinal Block Patient position: sitting Prep: DuraPrep Patient monitoring: heart rate, cardiac monitor, continuous pulse ox and blood pressure Approach: right paramedian Location: L3-4 Injection technique: single-shot Needle Needle type: Sprotte  Needle gauge: 24 G Needle length: 9 cm Assessment Sensory level: T4     

## 2019-04-27 NOTE — Op Note (Signed)

## 2019-04-27 NOTE — Progress Notes (Signed)
Pt discharged in NAD,VSS, pain tolerable. Pt and friend given discharge instructions. All questions answered. Pt discharged with walker and bedside commode.

## 2019-04-27 NOTE — Interval H&P Note (Signed)
History and Physical Interval Note:  04/27/2019 7:30 AM  Albert Salinas  has presented today for surgery, with the diagnosis of RIGHT HIP DEGENERATIVE JOINT DISEASE.  The various methods of treatment have been discussed with the patient and family. After consideration of risks, benefits and other options for treatment, the patient has consented to  Procedure(s) with comments: RIGHT TOTAL HIP ARTHROPLASTY ANTERIOR APPROACH (Right) - pending PRE-OP FOR SDDC APPOROVAL as a surgical intervention.  The patient's history has been reviewed, patient examined, no change in status, stable for surgery.  I have reviewed the patient's chart and labs.  Questions were answered to the patient's satisfaction.     Velna Ochs

## 2019-04-28 ENCOUNTER — Encounter: Payer: Self-pay | Admitting: *Deleted

## 2019-05-04 ENCOUNTER — Other Ambulatory Visit: Payer: Self-pay | Admitting: Orthopaedic Surgery

## 2019-05-04 DIAGNOSIS — R911 Solitary pulmonary nodule: Secondary | ICD-10-CM

## 2019-05-04 DIAGNOSIS — R9389 Abnormal findings on diagnostic imaging of other specified body structures: Secondary | ICD-10-CM

## 2019-05-10 ENCOUNTER — Other Ambulatory Visit: Payer: Commercial Managed Care - PPO

## 2019-05-17 ENCOUNTER — Ambulatory Visit
Admission: RE | Admit: 2019-05-17 | Discharge: 2019-05-17 | Disposition: A | Discharge status: Home / Self Care | Payer: Commercial Managed Care - PPO | Source: Ambulatory Visit | Attending: Orthopaedic Surgery | Admitting: Orthopaedic Surgery

## 2019-05-17 DIAGNOSIS — R9389 Abnormal findings on diagnostic imaging of other specified body structures: Secondary | ICD-10-CM

## 2021-08-14 IMAGING — CT CT CHEST W/O CM
2 of 4 series · 11 of 36 positions shown, 13 images · non-contrast
Comparison: April 23, 2019.

CLINICAL DATA: Nodule.

EXAM:
CT CHEST WITHOUT CONTRAST
TECHNIQUE: Multidetector CT imaging of the chest was performed following the
standard protocol without IV contrast.

[Series 2: chest 2.00 br40 s3 · axial · 0.55mm/px · z∈[+1528,+1832]mm · 8 of 176 slices shown, 10 images (1 of 2)]
[im 12/176  mediastinal]
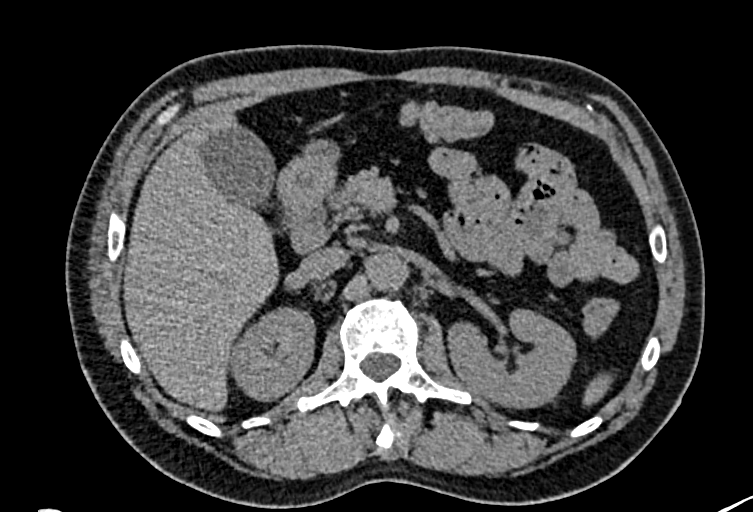
[im 12/176  lung]
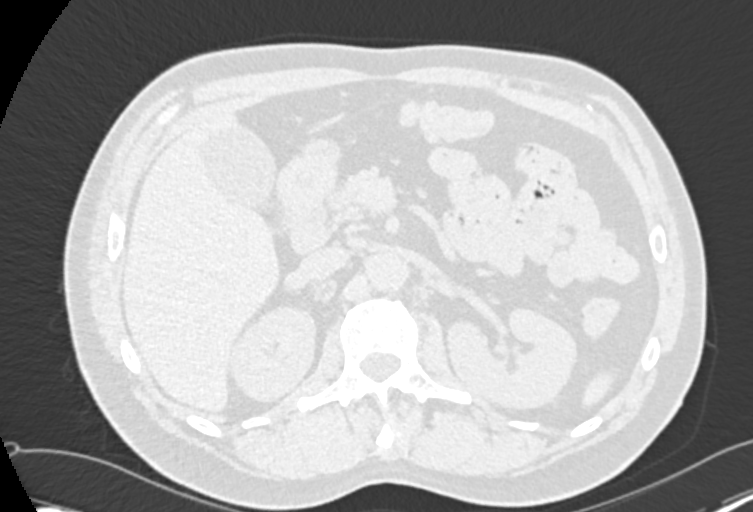
[im 36/176  lung]
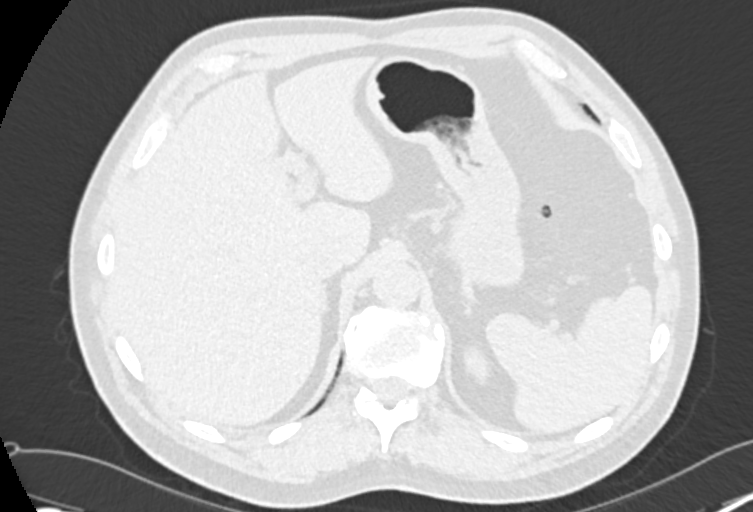
[im 59/176  lung]
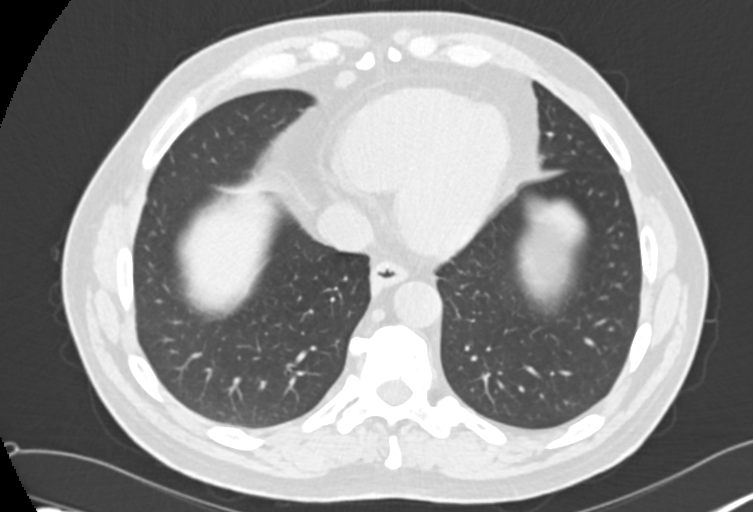
[im 82/176  lung]
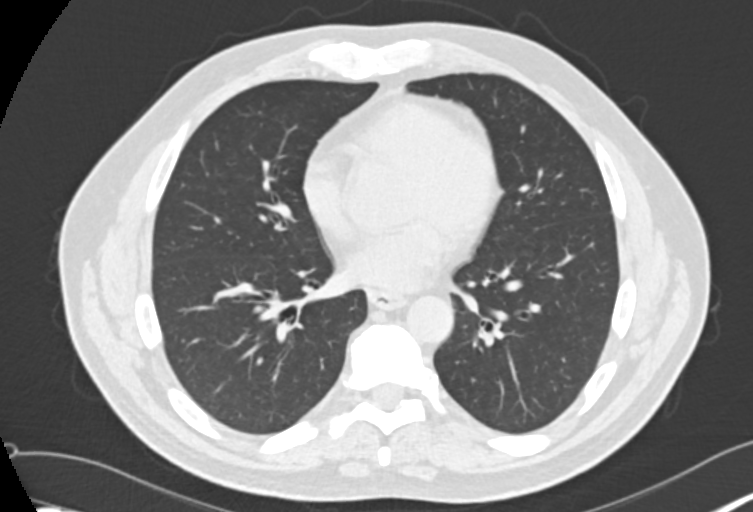
[im 94/176  mediastinal]
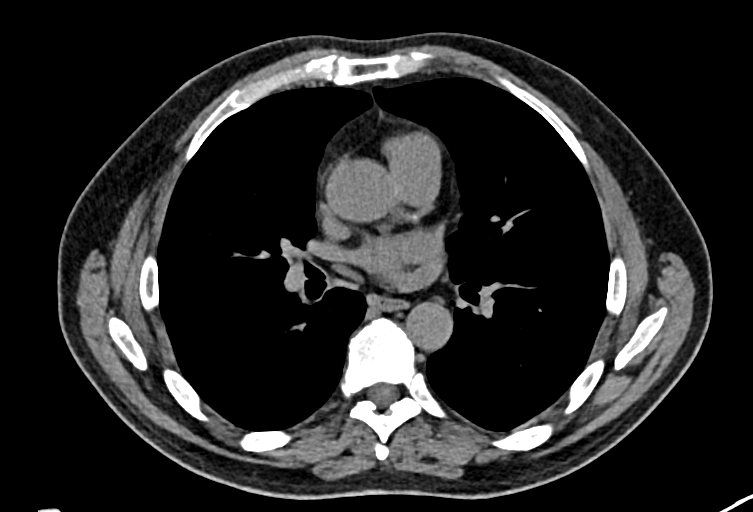
[im 94/176  lung]
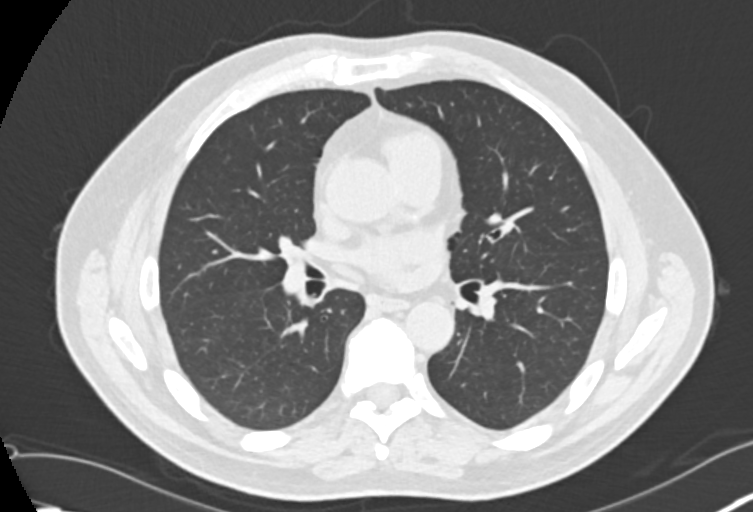
[im 117/176  lung]
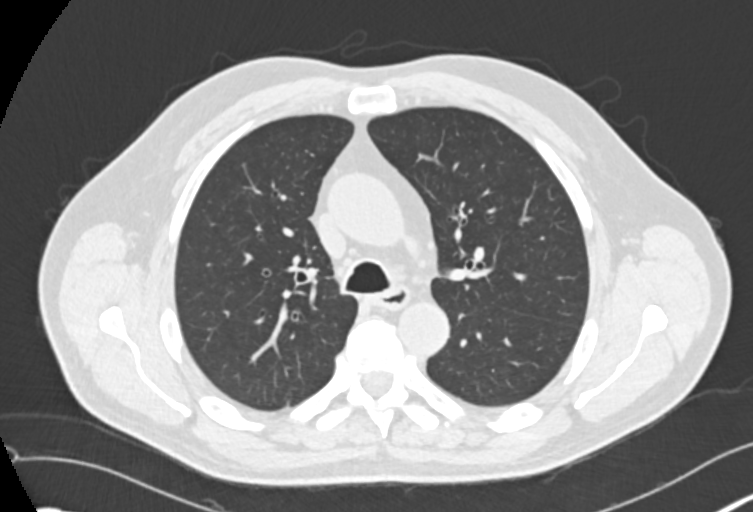
[im 141/176  lung]
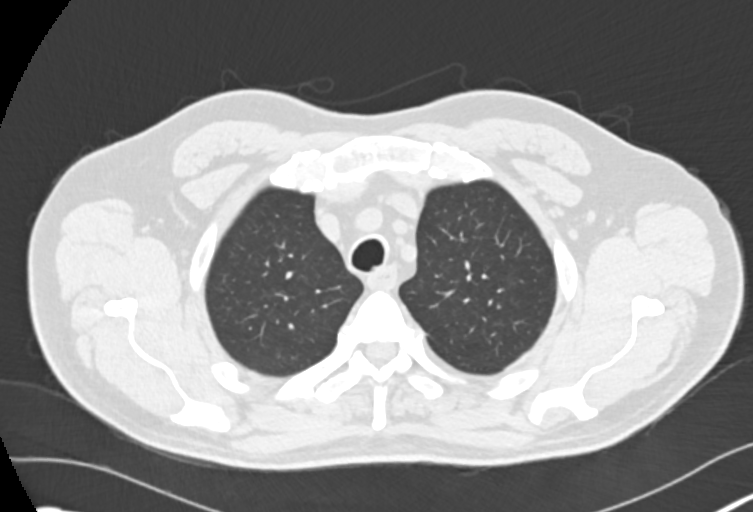
[im 164/176  lung]
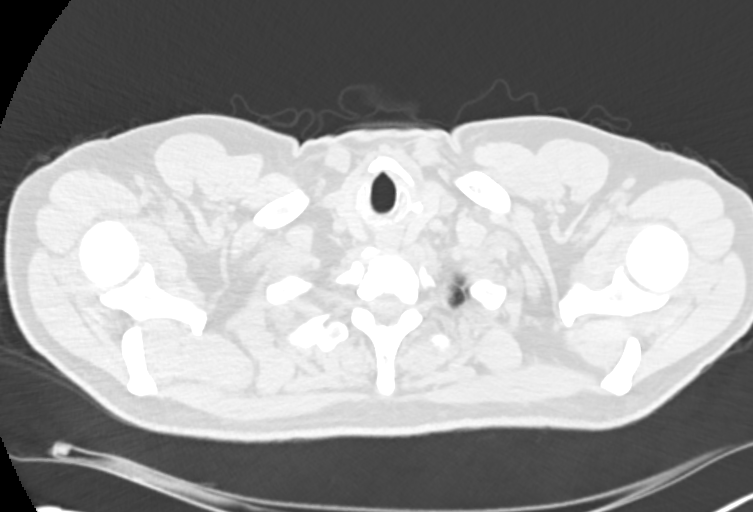

[Series 4: chest 2.00 br40 s3 · coronal · 0.69mm/px · 3 of 141 slices shown (2 of 2)]
[im 29/141  lung]
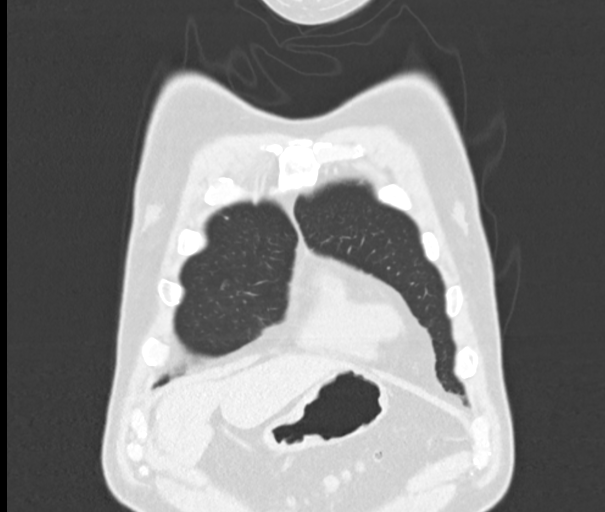
[im 57/141  lung]
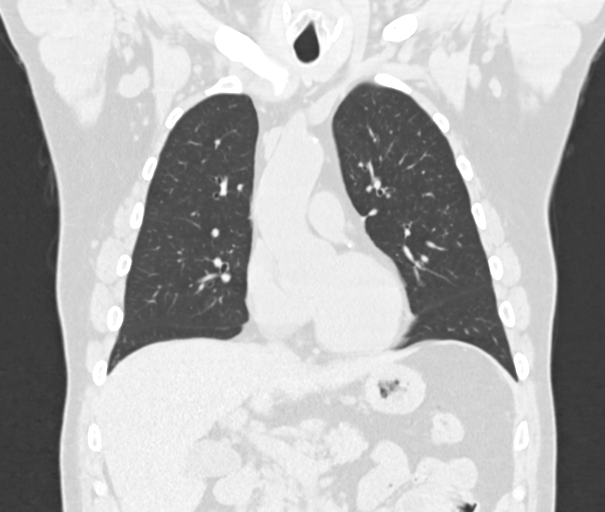
[im 85/141  lung]
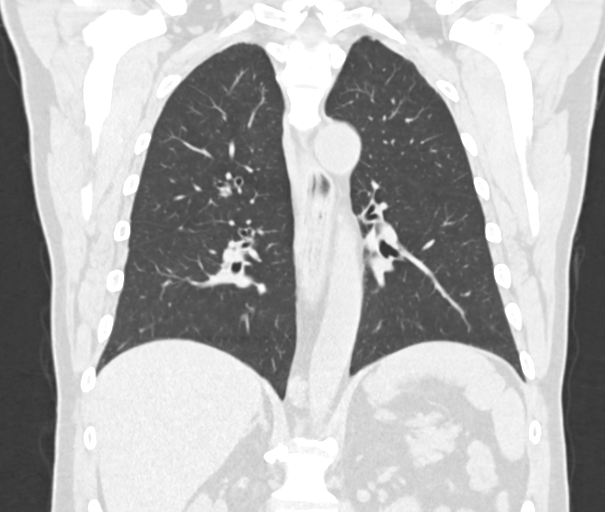

[11 of 36 positions shown; findings below may reference images not displayed]

FINDINGS: Cardiovascular: No significant vascular findings. Normal heart size.
No pericardial effusion.

Mediastinum/Nodes: No enlarged mediastinal or axillary lymph nodes.
Thyroid gland, trachea, and esophagus demonstrate no significant
findings.

Lungs/Pleura: No pneumothorax or pleural effusion is noted. Left
lung is clear. 3 mm nodular density is seen adjacent to right minor
fissure in right middle lobe best seen on image number 69 of series
6 which most likely represents intrapulmonary lymph node. 4 mm
subpleural nodule is seen in right middle lobe best seen on image
number 115 of series 8.

Upper Abdomen: No acute abnormality.

Musculoskeletal: No chest wall mass or suspicious bone lesions
identified.
IMPRESSION: 4 mm subpleural nodule is seen in right middle lobe. No follow-up
needed if patient is low-risk. Non-contrast chest CT can be
considered in 12 months if patient is high-risk. This recommendation
follows the consensus statement: Guidelines for Management of
Incidental Pulmonary Nodules Detected on CT Images: From the

3 mm nodular density is seen adjacent to right minor fissure in
right middle lobe which most likely represents intrapulmonary lymph
node.
